# Patient Record
Sex: Female | Born: 2000 | Race: Black or African American | Hispanic: No | Marital: Single | Smoking: Never smoker
Health system: Southern US, Community
[De-identification: ages and names within clinical notes are randomized; demographics above are authoritative.]

## PROBLEM LIST (undated history)

## (undated) ENCOUNTER — Ambulatory Visit: Payer: BC Managed Care – PPO | Source: Home / Self Care

## (undated) DIAGNOSIS — G43909 Migraine, unspecified, not intractable, without status migrainosus: Secondary | ICD-10-CM

---
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
## 2020-01-27 ENCOUNTER — Encounter: Payer: Self-pay | Admitting: Women's Health

## 2020-01-27 ENCOUNTER — Ambulatory Visit (INDEPENDENT_AMBULATORY_CARE_PROVIDER_SITE_OTHER): Payer: Medicaid Other | Admitting: Women's Health

## 2020-01-27 ENCOUNTER — Other Ambulatory Visit: Payer: Self-pay

## 2020-01-27 VITALS — BP 100/58 | HR 79 | Ht 65.0 in | Wt 117.4 lb

## 2020-01-27 DIAGNOSIS — A630 Anogenital (venereal) warts: Secondary | ICD-10-CM

## 2020-01-27 MED ORDER — IMIQUIMOD 5 % EX CREA: TOPICAL_CREAM | CUTANEOUS | 0 refills | Status: DC

## 2020-01-27 NOTE — Patient Instructions (Addendum)
HPV (Human Papillomavirus) Vaccine: What You Need to Know 1. Why get vaccinated? HPV (Human papillomavirus) vaccine can prevent infection with some types of human papillomavirus. HPV infections can cause certain types of cancers including:  cervical, vaginal and vulvar cancers in women,  penile cancer in men, and  anal cancers in both men and women. HPV vaccine prevents infection from the HPV types that cause over 90% of these cancers. HPV is spread through intimate skin-to-skin or sexual contact. HPV infections are so common that nearly all men and women will get at least one type of HPV at some time in their lives. Most HPV infections go away by themselves within 2 years. But sometimes HPV infections will last longer and can cause cancers later in life. 2. HPV vaccine HPV vaccine is routinely recommended for adolescents at 76 or 19 years of age to ensure they are protected before they are exposed to the virus. HPV vaccine may be given beginning at age 31 years, and as late as age 17 years. Most people older than 26 years will not benefit from HPV vaccination. Talk with your health care provider if you want more information. Most children who get the first dose before 46 years of age need 2 doses of HPV vaccine. Anyone who gets the first dose on or after 19 years of age, and younger people with certain immunocompromising conditions, need 3 doses. Your health care provider can give you more information. HPV vaccine may be given at the same time as other vaccines. 3. Talk with your health care provider Tell your vaccine provider if the person getting the vaccine:  Has had an allergic reaction after a previous dose of HPV vaccine, or has any severe, life-threatening allergies.  Is pregnant. In some cases, your health care provider may decide to postpone HPV vaccination to a future visit. People with minor illnesses, such as a cold, may be vaccinated. People who are moderately or severely ill  should usually wait until they recover before getting HPV vaccine. Your health care provider can give you more information. 4. Risks of a vaccine reaction  Soreness, redness, or swelling where the shot is given can happen after HPV vaccine.  Fever or headache can happen after HPV vaccine. People sometimes faint after medical procedures, including vaccination. Tell your provider if you feel dizzy or have vision changes or ringing in the ears. As with any medicine, there is a very remote chance of a vaccine causing a severe allergic reaction, other serious injury, or death. 5. What if there is a serious problem? An allergic reaction could occur after the vaccinated person leaves the clinic. If you see signs of a severe allergic reaction (hives, swelling of the face and throat, difficulty breathing, a fast heartbeat, dizziness, or weakness), call 9-1-1 and get the person to the nearest hospital. For other signs that concern you, call your health care provider. Adverse reactions should be reported to the Vaccine Adverse Event Reporting System (VAERS). Your health care provider will usually file this report, or you can do it yourself. Visit the VAERS website at www.vaers.SamedayNews.es or call 959-256-4543. VAERS is only for reporting reactions, and VAERS staff do not give medical advice. 6. The National Vaccine Injury Compensation Program The [employer] Vaccine Injury Compensation Program (VICP) is a federal program that was created to compensate people who may have been injured by certain vaccines. Visit the VICP website at GoldCloset.com.ee or call 973 792 1440 to learn about the program and about filing a claim. There  is a time limit to file a claim for compensation. 7. How can I learn more?  Ask your health care provider.  Call your local or state health department.  Contact the Centers for Disease Control and Prevention (CDC): ? Call 661-016-4750 (1-800-CDC-INFO) or ? Visit CDC's  website at PicCapture.uy Vaccine Information Statement HPV Vaccine (12/06/2017) This information is not intended to replace advice given to you by your health care provider. Make sure you discuss any questions you have with your health care provider. Document Revised: 05/15/2018 Document Reviewed: 09/05/2017 Elsevier Patient Education  2020 Elsevier Inc.        Genital Warts Genital warts are a common STD (sexually transmitted disease). They may appear as small bumps on the skin of the genital and anal areas. They sometimes become irritated and cause pain. Genital warts are easily passed to other people through sexual contact. Many people do not know that they are infected. They may be infected for years without symptoms. However, even if they do not have symptoms, they can pass the infection to their sexual partners. Getting treatment is important because genital warts can lead to other problems. In females, the virus that causes genital warts may increase the risk for cervical cancer. What are the causes? This condition is caused by a virus that is called human papillomavirus (HPV). HPV is spread by having unprotected sex with an infected person. It can be spread through vaginal, anal, and oral sex. What increases the risk? You are more likely to develop this condition if:  You have unprotected sex.  You have multiple sexual partners.  You are sexually active before age 8.  You are a man who isnot circumcised.  You have a female sexual partner who is not circumcised.  You have a weakened body defense system (immune system) due to disease or medicine.  You smoke. What are the signs or symptoms? Symptoms of this condition include:  Small growths in the genital area or anal area. These warts often grow in clusters.  Itching and irritation in the genital area or anal area.  Bleeding from the warts.  Pain during sex. How is this diagnosed? This condition is diagnosed  based on:  Your symptoms.  A physical exam. You may also have other tests, including:  Biopsy. A tissue sample is removed so it can be checked under a microscope.  Colposcopy. In females, a magnifying tool is used to examine the vagina and cervix. Certain solutions may be used to make the HPV cells change color so they can be seen more easily.  A Pap test in females.  Tests for other STDs. How is this treated? This condition may be treated with:  Medicines, such as: ? Solutions or creams applied to your skin (topical).  Procedures, such as: ? Freezing the warts with liquid nitrogen (cryotherapy). ? Burning the warts with a laser or electric probe (electrocautery). ? Surgery to remove the warts. Follow these instructions at home: Medicines   Apply over-the-counter and prescription medicines only as told by your health care provider.  Do not treat genital warts with medicines that are used for treating hand warts.  Talk with your health care provider about using over-the-counter anti-itch creams. Instructions for women  Get screened regularly for cervical cancer. Women who have genital warts are at an increased risk for this cancer. This type of cancer is slow growing and can be cured if it is found early.  If you become pregnant, tell your health care provider  that you have had an HPV infection. Your health care provider will monitor you closely during pregnancy to be sure that your baby is safe. General instructions  Do not touch or scratch the warts.  Do not have sex until your treatment has been completed.  Tell your current and past sexual partners about your condition because they may also need treatment.  After treatment, use condoms during sex to prevent future infections.  Keep all follow-up visits as told by your health care provider. This is important. How is this prevented? Talk with your health care provider about getting the HPV vaccine. The  vaccine:  Can prevent some HPV infections and cancers.  Is recommended for males and females who are 5-87 years old.  Is not recommended for pregnant women.  Will not work if you already have HPV. Contact a health care provider if you:  Have redness, swelling, or pain in the area of the treated skin.  Have a fever.  Feel generally ill.  Feel lumps in and around your genital or anal area.  Have bleeding in your genital or anal area.  Have pain during sex. Summary  Genital warts are a common STD (sexually transmitted disease). It may appear as small bumps on the genital and anal areas.  This condition is caused by a virus that is called human papillomavirus (HPV). HPV is spread by having unprotected sex with an infected person. It can be spread through vaginal, anal, and oral sex.  Treatment is important because genital warts can lead to other problems. In females, the virus that causes genital warts may increase the risk for cervical cancer.  This condition may be treated with medicine that is applied to the skin, or procedures to remove the warts.  The HPV vaccine can prevent some HPV infections and cancers. It is recommended that the vaccine be given to males and females who are 71-9 years old. This information is not intended to replace advice given to you by your health care provider. Make sure you discuss any questions you have with your health care provider. Document Revised: 02/28/2017 Document Reviewed: 02/28/2017 Elsevier Patient Education  2020 Elsevier Inc.       Imiquimod skin cream What is this medicine? IMIQUIMOD (i mi KWI mod) cream is used to treat external genital or anal warts. It is also used to treat other skin conditions such as actinic keratosis and certain types of skin cancer. This medicine may be used for other purposes; ask your health care provider or pharmacist if you have questions. COMMON BRAND NAME(S): Celesta Aver What should I tell  my health care provider before I take this medicine? They need to know if you have any of these conditions:  decreased immune function  an unusual or allergic reaction to imiquimod, other medicines, foods, dyes, or preservatives  pregnant or trying to get pregnant  breast-feeding How should I use this medicine? This medicine is for external use only. Do not take by mouth. Follow the directions on the prescription label. Apply just before bedtime. Wash your hands before and after use. Apply a thin layer of cream and massage gently into the affected areas until no longer visible. Do not use in the mouth, eyes or the vagina. Use this medicine only on the affected area as directed by your health care provider. Do not use for longer than prescribed. It is important not to use more medicine than prescribed. To do so may increase the chance of side effects. Talk  to your pediatrician regarding the use of this medicine in children. While this drug may be prescribed for children as young as 76 years of age for selected conditions, precautions do apply. Overdosage: If you think you have taken too much of this medicine contact a poison control center or emergency room at once. NOTE: This medicine is only for you. Do not share this medicine with others. What if I miss a dose? If you miss a dose, use it as soon as you can. If it is almost time for your next dose, use only that dose. Do not use double or extra doses. What may interact with this medicine? Interactions are not expected. Do not use any other medicines on the treated area without asking your doctor or health care professional. This list may not describe all possible interactions. Give your health care provider a list of all the medicines, herbs, non-prescription drugs, or dietary supplements you use. Also tell them if you smoke, drink alcohol, or use illegal drugs. Some items may interact with your medicine. What should I watch for while using this  medicine? Visit your health care professional for regular checks on your progress. Do not use this medicine until the skin has healed from any other drug (example: podofilox or podophyllin resin) or surgical skin treatment. Females should receive regular pelvic exams while being treated for genital warts. Most patients see improvement within 4 weeks. It may take up to 16 weeks to see a full clearing of the warts. This medicine is not a cure. New warts may develop during or after treatment. Avoid sexual (genital, anal, oral) contact while the cream is on the skin. If warts are visible in the genital area, sexual contact should be avoided until the warts are treated. The use of latex condoms during sexual contact may reduce, but not entirely prevent, infecting others. This medicine may weaken condoms, diaphragms, cervical caps or other barrier devices and make them less effective as birth control. Do not cover the treated area with an airtight bandage. Cotton gauze dressings can be used. Cotton underwear can be worn after using this medicine on the genital or anal area. Actinic keratoses that were not seen before may appear during treatment and may later go away. The treatment area and surrounding area may lighten or darken after treatment with this medicine. These skin color changes may be permanent in some patients. If you experience a skin reaction at the treatment site that interferes or prevents you from doing any daily activity, contact your health care provider. You may need a rest period from treatment. Treatment may be restarted once the reaction has gotten better as recommended by your doctor or health care professional. This medicine can make you more sensitive to the sun. Keep out of the sun. If you cannot avoid being in the sun, wear protective clothing and use sunscreen. Do not use sun lamps or tanning beds/booths. What side effects may I notice from receiving this medicine? Side effects that you  should report to your doctor or health care professional as soon as possible:  open sores with or without drainage  skin infection  skin rash  unusual or severe skin reaction Side effects that usually do not require medical attention (report to your doctor or health care professional if they continue or are bothersome):  burning or itching  redness of the skin (very common but is usually not painful or harmful)  scabbing, crusting, or peeling skin  skin that becomes hard or  thickened  swelling of the skin This list may not describe all possible side effects. Call your doctor for medical advice about side effects. You may report side effects to FDA at 1-800-FDA-1088. Where should I keep my medicine? Keep out of the reach of children. Store between 4 and 25 degrees C (39 and 77 degrees F). Do not freeze. Throw away any unused medicine after the expiration date. Discard packet after applying to affected area. Partial packets should not be saved or reused. NOTE: This sheet is a summary. It may not cover all possible information. If you have questions about this medicine, talk to your doctor, pharmacist, or health care provider.  2020 Elsevier/Gold Standard (2008-01-08 10:33:25)

## 2020-01-27 NOTE — Progress Notes (Addendum)
History:  [name] is a 19 y.o. No obstetric history on file. who presents to clinic today for genital warts. Patient was told by her pediatrician that she had genital warts. Patient reports she was 4 warts present at this time next to the rectum. Patient reports she has not had the HPV vaccine. Patient reports she is here today because she would like the warts removed. Patient denies bleeding or other concerns regarding the warts. Patient currently has Nexplanon for birth control. Patient reports NKDA, is not currently on any medications, and reports she has no diagnosed medical conditions.  The following portions of the patient's history were reviewed and updated as appropriate: allergies, current medications, family history, past medical history, social history, past surgical history and problem list.  Review of Systems:  Review of Systems  Constitutional: Negative for chills and fever.  Respiratory: Negative for shortness of breath.   Cardiovascular: Negative for chest pain.  Genitourinary: Negative for dysuria, frequency and urgency.  Neurological: Negative for headaches.     Objective:  Physical Exam BP (!) 100/58   Pulse 79   Ht 5\' 5"  (1.651 m)   Wt 117 lb 6.4 oz (53.3 kg)   BMI 19.54 kg/m    Physical Exam Vitals and nursing note reviewed. Exam conducted with a chaperone present.  Constitutional:      General: She is not in acute distress.    Appearance: Normal appearance. She is not ill-appearing, toxic-appearing or diaphoretic.  HENT:     Head: Normocephalic and atraumatic.  Pulmonary:     Effort: Pulmonary effort is normal.  Abdominal:     Palpations: Abdomen is soft.  Genitourinary:    General: Normal vulva.     Labia:        Right: No rash, tenderness or lesion.        Left: No rash, tenderness or lesion.     Skin:    General: Skin is warm and dry.  Neurological:     Mental Status: She is alert and oriented to person, place, and time.  Psychiatric:         Mood and Affect: Mood normal.        Behavior: Behavior normal.        Thought Content: Thought content normal.        Judgment: Judgment normal.    Labs and Imaging No results found for this or any previous visit (from the past 24 hour(s)).  No results found.   Assessment & Plan:   1. Genital warts - imiquimod (ALDARA) 5 % cream; Apply topically 3 (three) times a week.  Dispense: 12 each; Refill: 0 Hand washing before and after cream application is recommended. The patient applies imiquimod cream directly to the clean dry warty tissue at bedtime, rubbing it in until the cream is no longer visible; this area is washed with mild soap and water 6 to 10 hours later. Sexual contact should be avoided while the cream is on the skin. The cream can weaken condoms and diaphragms. Aldara is applied three days per week (Monday-Wednesday-Friday) for up to 16 weeks. A mild, local inflammatory reaction (erythema, induration, ulceration/erosion, itching, burning, vesicles) should occur, which is a sign the drug is working. It is generally not so severe as to preclude further treatment. If severe inflammation occurs, use of the drug should be stopped until the inflammation clears and then it can be restarted at a lower frequency. -pt declines HPV vaccine  Approximately 5 minutes  of total time was spent with this patient on care and education.  Marylen Ponto, NP 01/27/2020 4:17 PM

## 2020-01-27 NOTE — Progress Notes (Signed)
New patient in office Currently has nexplanon placed 02-06-2019 Reports irregular bleeding since getting nexplanon Pt reports that genital warts are not currently painful but cause discomfort during bowel movements.

## 2021-08-11 NOTE — Progress Notes (Deleted)
  Subjective:    ELDONNA NEUENFELDT - 21 y.o. female MRN [id number]  Date of birth: 2000/08/27  HPI  Kymari D Gopal is to establish care.   Current issues and/or concerns:  ROS per HPI     Health Maintenance:  Health Maintenance Due  Topic Date Due   HPV VACCINES (1 - 2-dose series) Never done   CHLAMYDIA SCREENING  Never done   HIV Screening  Never done   Hepatitis C Screening  Never done   TETANUS/TDAP  Never done     Past Medical History: There are no problems to display for this patient.     Social History   reports that she has never smoked. She has never used smokeless tobacco. She reports current alcohol use. She reports current drug use. Drug: Marijuana.   Family History  family history includes Cancer in her maternal grandmother; Diabetes in her maternal grandfather; Hypertension in her father; Stroke in her maternal grandfather.   Medications: reviewed and updated   Objective:   Physical Exam There were no vitals taken for this visit. Physical Exam      Assessment & Plan:         Patient was given clear instructions to go to Emergency Department or return to medical center if symptoms don't improve, worsen, or new problems develop.The patient verbalized understanding.  I discussed the assessment and treatment plan with the patient. The patient was provided an opportunity to ask questions and all were answered. The patient agreed with the plan and demonstrated an understanding of the instructions.   The patient was advised to call back or seek an in-person evaluation if the symptoms worsen or if the condition fails to improve as anticipated.    Ricky Stabs, NP 08/11/2021, 11:58 AM [facility] at [facility]

## 2021-08-20 ENCOUNTER — Ambulatory Visit: Payer: Medicaid Other | Admitting: Family

## 2021-08-20 DIAGNOSIS — Z7689 Persons encountering health services in other specified circumstances: Secondary | ICD-10-CM

## 2022-04-21 IMAGING — CT CT HEAD W/O CM
3 series · 15 of 47 positions shown, 18 images · non-contrast
Comparison: None.

CLINICAL DATA: Neuro deficit.

EXAM:
CT HEAD WITHOUT CONTRAST
TECHNIQUE: Contiguous axial images were obtained from the base of the skull
through the vertex without intravenous contrast.

[Series 2: head wo · axial · 0.45mm/px · z∈[+1175,+1300]mm · 9 of 30 slices shown, 12 images]
[im 3/30  brain]
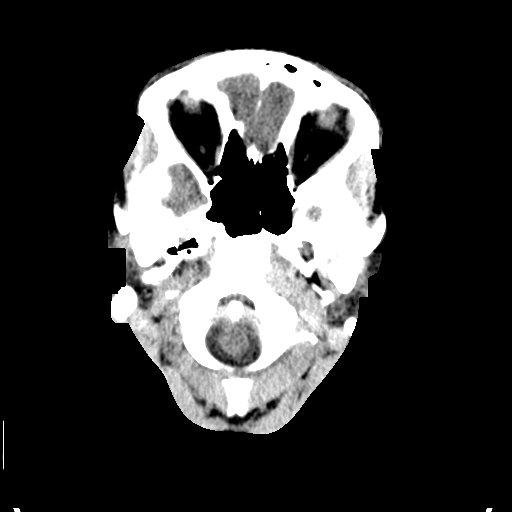
[im 3/30  bone]
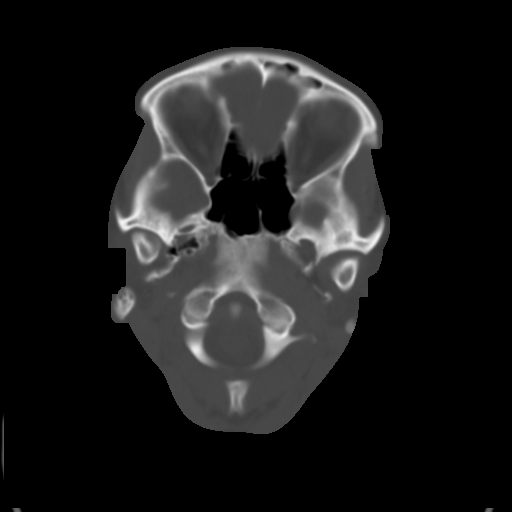
[im 6/30  brain]
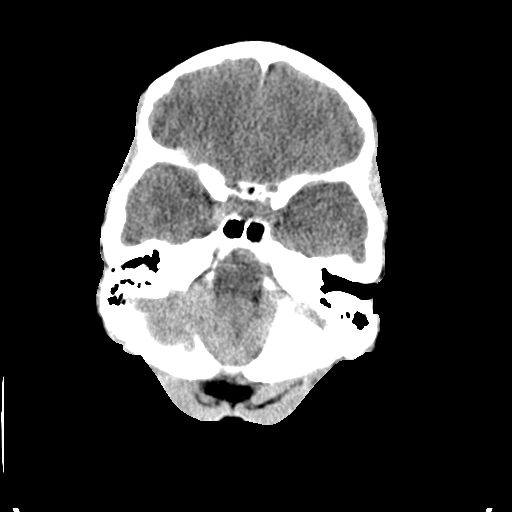
[im 9/30  brain]
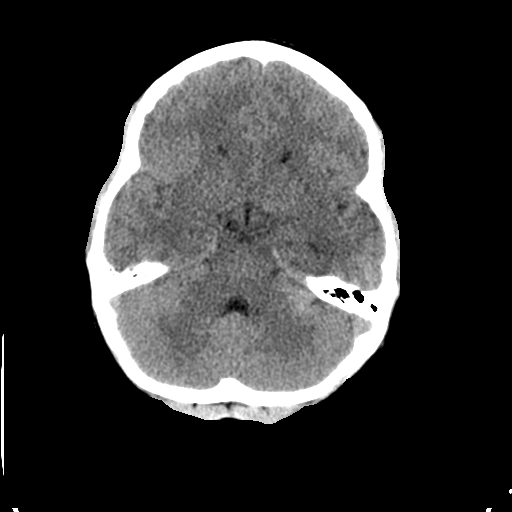
[im 12/30  brain]
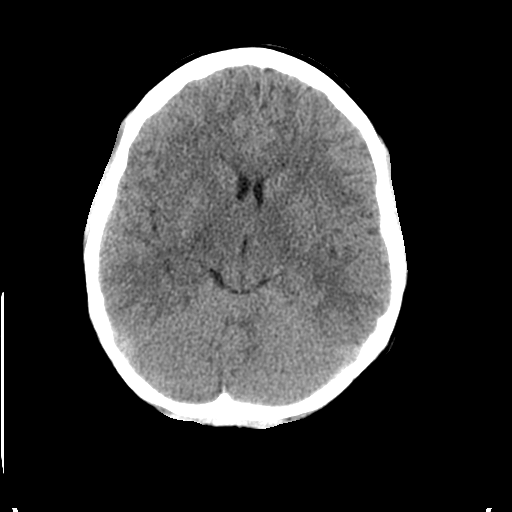
[im 16/30  brain]
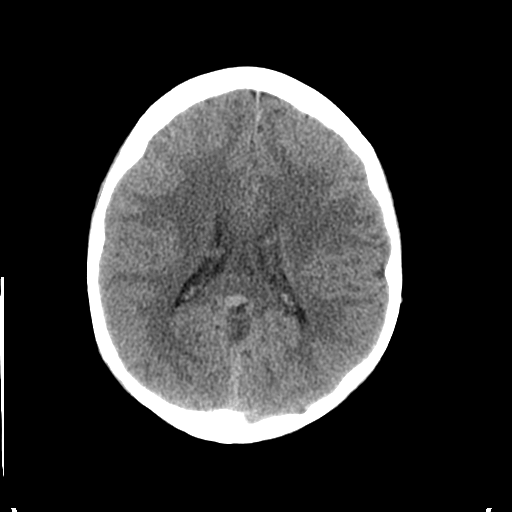
[im 16/30  bone]
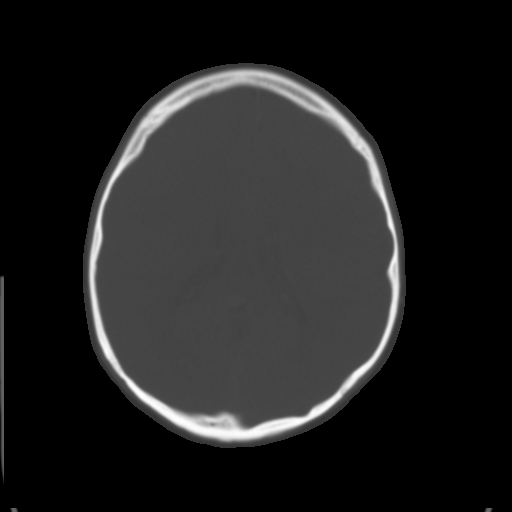
[im 19/30  brain]
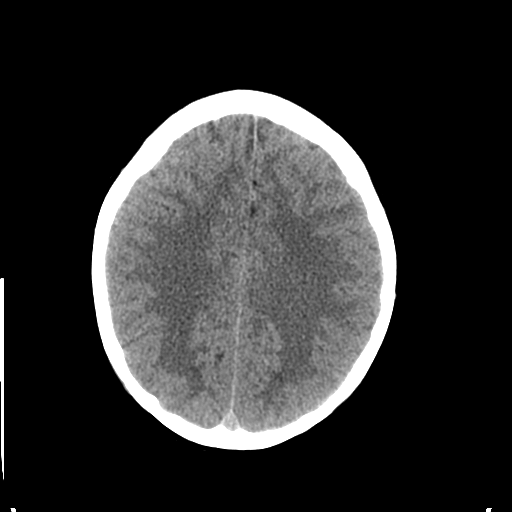
[im 22/30  brain]
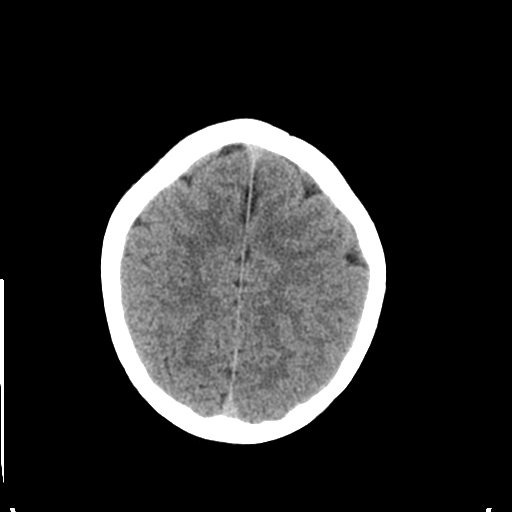
[im 25/30  brain]
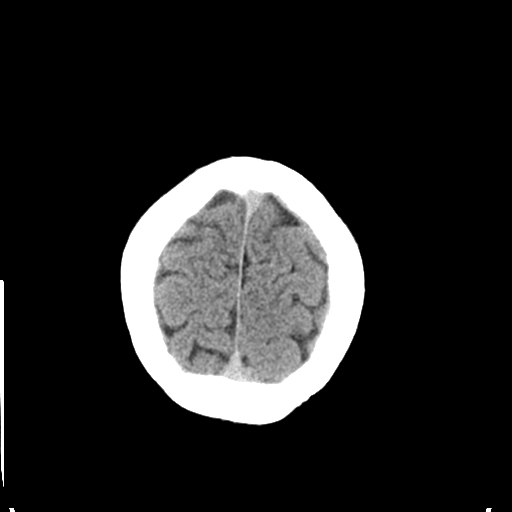
[im 28/30  brain]
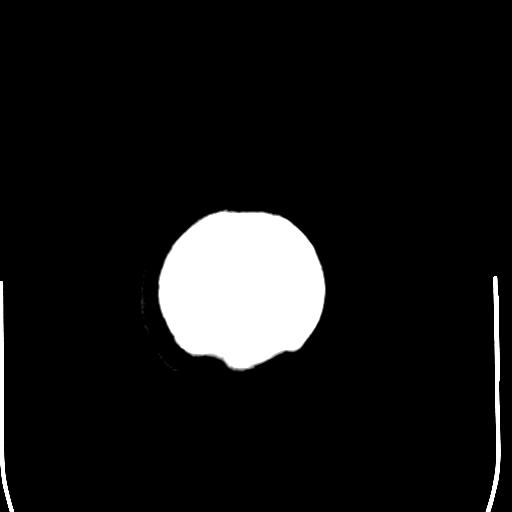
[im 28/30  bone]
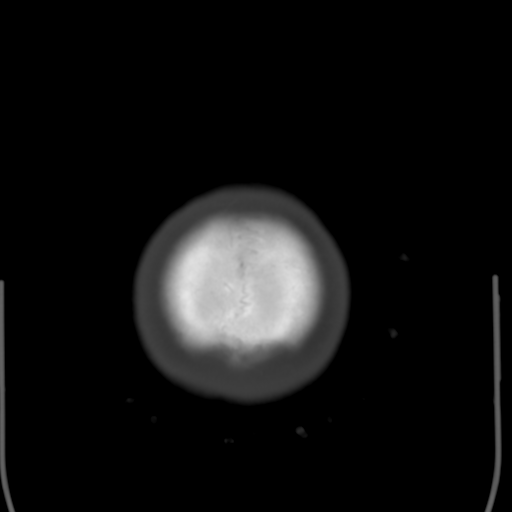

[Series 5: coronal soft tissue · coronal · 0.33mm/px · 3 of 77 slices shown]
[im 28/77  brain]
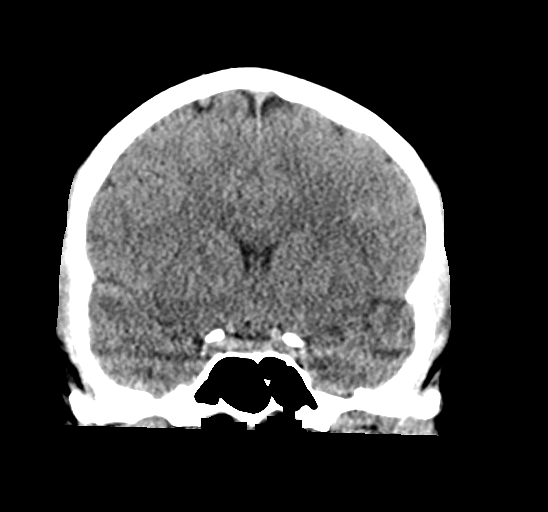
[im 35/77  brain]
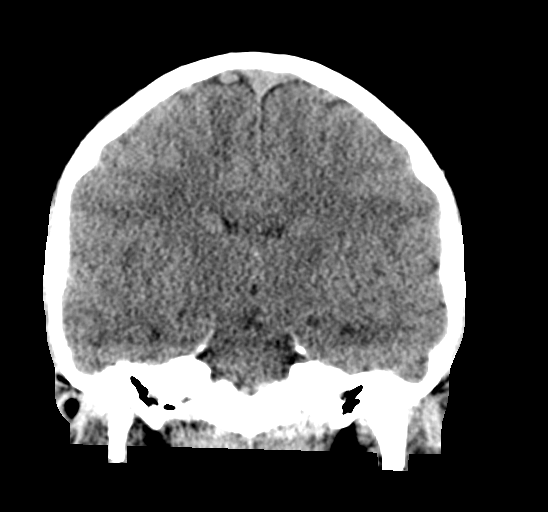
[im 42/77  brain]
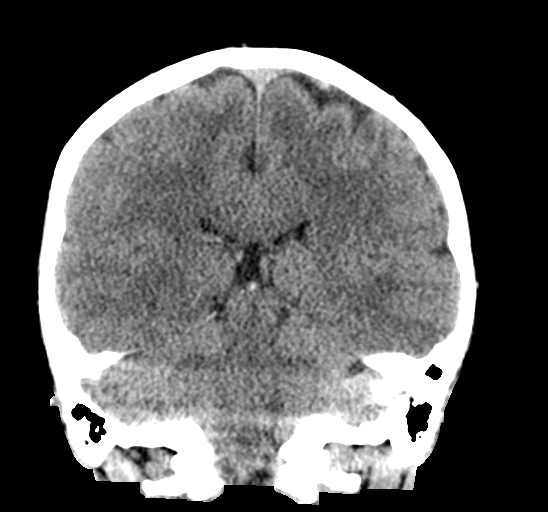

[Series 6: sagittal soft tissue · sagittal · 0.34mm/px · 3 of 58 slices shown]
[im 20/58  brain]
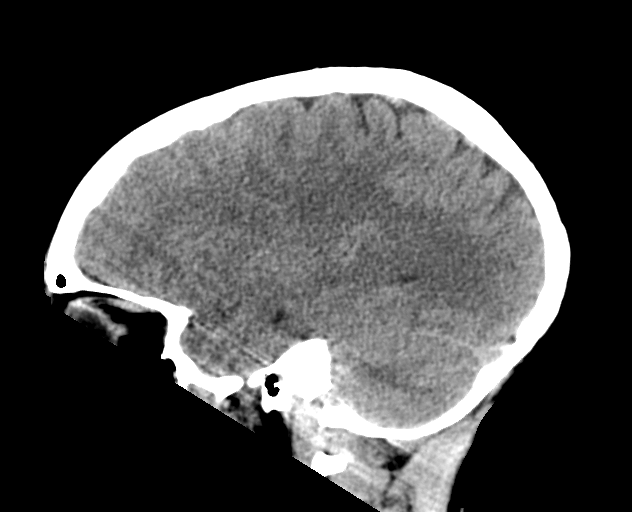
[im 29/58  brain]
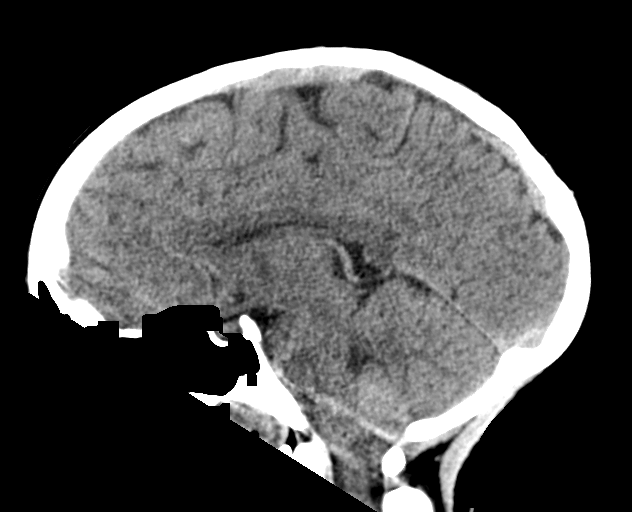
[im 39/58  brain]
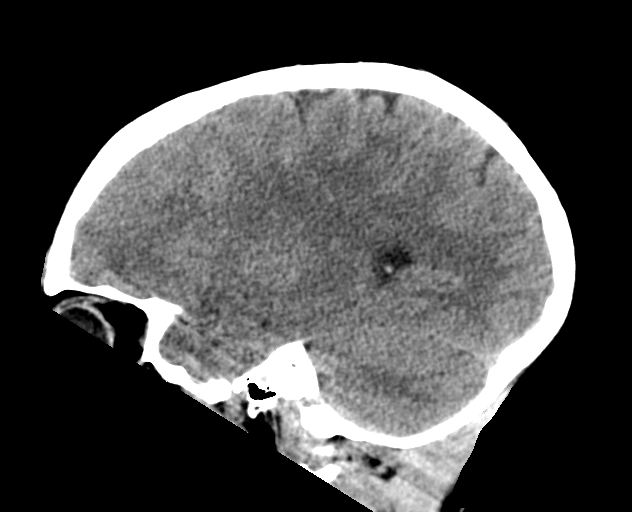

[15 of 47 positions shown; findings below may reference images not displayed]

FINDINGS: Brain: No acute hemorrhage. No hydrocephalus. No mass lesion. There
is mild effacement of the suprasellar cistern with narrowing of the
mamillopontine distance (measuring 3.7 mm). There is mild cerebellar
tonsillar ectopia, measuring approximately 3 mm below the foramina
magnum. Mild effacement of the basal cisterns. No subdural
collections.

Vascular: No hyperdense vessel or unexpected calcification.

Skull: Normal. Negative for fracture or focal lesion.

Sinuses/Orbits: No acute finding.
IMPRESSION: 1. No acute traumatic intracranial abnormality.
2. Mild narrowing of the mamillopontine angle, mild cerebellar
tonsillar ectopia (which does not meet criteria for a Chiari) and
effacement of the suprasellar cistern. Findings are nonspecific, but
can be seen in the setting of intracranial hypotension. Recommend
clinical correlation with history of positional headaches. An
outpatient MRI could further evaluate, if clinically indicated.

## 2022-05-26 ENCOUNTER — Ambulatory Visit
Admission: EM | Admit: 2022-05-26 | Discharge: 2022-05-26 | Disposition: A | Discharge status: Home / Self Care | Payer: BC Managed Care – PPO | Attending: Family Medicine | Admitting: Family Medicine

## 2022-05-26 DIAGNOSIS — N911 Secondary amenorrhea: Secondary | ICD-10-CM | POA: Diagnosis present

## 2022-05-26 DIAGNOSIS — J069 Acute upper respiratory infection, unspecified: Secondary | ICD-10-CM | POA: Diagnosis present

## 2022-05-26 DIAGNOSIS — U071 COVID-19: Secondary | ICD-10-CM | POA: Diagnosis not present

## 2022-05-26 LAB — POCT INFLUENZA A/B
Influenza A, POC: NEGATIVE
Influenza B, POC: NEGATIVE

## 2022-05-26 LAB — POCT URINE PREGNANCY: Preg Test, Ur: NEGATIVE

## 2022-05-26 MED ORDER — BENZONATATE 100 MG PO CAPS: 100.0000 mg | ORAL_CAPSULE | Freq: Three times a day (TID) | ORAL | 0 refills | Status: DC | PRN

## 2022-05-26 MED ORDER — IBUPROFEN 800 MG PO TABS: 800.0000 mg | ORAL_TABLET | Freq: Three times a day (TID) | ORAL | 0 refills | Status: AC | PRN

## 2022-05-26 NOTE — ED Triage Notes (Signed)
Pt presents with non productive cough, sore throat, chills, fatigue, and headache X 2 days.

## 2022-05-26 NOTE — ED Provider Notes (Signed)
[facility]    CSN: [id number] Arrival date & time: 05/26/22  1016      History   Chief Complaint Chief Complaint  Patient presents with   URI   Headache    HPI Victoria Estrada is a 22 y.o. female.    URI Associated symptoms: headaches   Headache Associated symptoms: URI    Here for cough, sore throat when coughs, postnasal drainage, fever and chills. Fever to 101 this AM. Symptoms began 4/15 in the evening.  No n/v/d. She has had some h/a and malaise also.  LMP sometime in December. No longer has "implant" as noted in side bar.  Past Medical History:  Diagnosis Date   Migraine    Migraines     There are no problems to display for this patient.   History reviewed. No pertinent surgical history.  OB History   No obstetric history on file.      Home Medications    Prior to Admission medications   Medication Sig Start Date End Date Taking? Authorizing Provider  benzonatate (TESSALON) 100 MG capsule Take 1 capsule (100 mg total) by mouth 3 (three) times daily as needed for cough. 05/26/22  Yes Zenia Resides, MD  ibuprofen (ADVIL) 800 MG tablet Take 1 tablet (800 mg total) by mouth every 8 (eight) hours as needed (pain). 05/26/22  Yes Zenia Resides, MD  etonogestrel (NEXPLANON) 68 MG IMPL implant 1 each by Subdermal route once.    [provider]  imiquimod (ALDARA) 5 % cream Apply topically 3 (three) times a week. 01/27/20   Nugent, Odie Sera, NP    Family History Family History  Problem Relation Age of Onset   Cancer Maternal Grandmother    Diabetes Maternal Grandfather    Stroke Maternal Grandfather    Hypertension Father     Social History Social History   Tobacco Use   Smoking status: Never   Smokeless tobacco: Never  Vaping Use   Vaping Use: Never used  Substance Use Topics   Alcohol use: Yes    Comment: occasionally   Drug use: Yes    Types: Marijuana     Allergies   Patient has no known  allergies.   Review of Systems Review of Systems  Neurological:  Positive for headaches.     Physical Exam Triage Vital Signs ED Triage Vitals  Enc Vitals Group     BP 05/26/22 1119 118/73     Pulse Rate 05/26/22 1119 (!) 115     Resp 05/26/22 1119 17     Temp 05/26/22 1119 98.7 F (37.1 C)     Temp Source 05/26/22 1119 Oral     SpO2 05/26/22 1119 95 %     Weight --      Height --      Head Circumference --      Peak Flow --      Pain Score 05/26/22 1118 7     Pain Loc --      Pain Edu? --      Excl. in GC? --    No data found.  Updated Vital Signs BP 118/73 (BP Location: Left Arm)   Pulse (!) 115   Temp 98.7 F (37.1 C) (Oral)   Resp 17   LMP 02/06/2022   SpO2 95%   Visual Acuity Right Eye Distance:   Left Eye Distance:   Bilateral Distance:    Right Eye Near:   Left Eye Near:  Bilateral Near:     Physical Exam Vitals reviewed.  Constitutional:      General: She is not in acute distress.    Appearance: She is not toxic-appearing.  HENT:     Right Ear: Tympanic membrane and ear canal normal.     Left Ear: Tympanic membrane and ear canal normal.     Nose: Congestion present.     Mouth/Throat:     Mouth: Mucous membranes are moist.     Comments: There is white and clear mucus draining in the oropharynx Eyes:     Extraocular Movements: Extraocular movements intact.     Conjunctiva/sclera: Conjunctivae normal.     Pupils: Pupils are equal, round, and reactive to light.  Cardiovascular:     Rate and Rhythm: Normal rate and regular rhythm.     Heart sounds: No murmur heard. Pulmonary:     Effort: Pulmonary effort is normal. No respiratory distress.     Breath sounds: No stridor. No wheezing, rhonchi or rales.  Musculoskeletal:     Cervical back: Neck supple.  Lymphadenopathy:     Cervical: No cervical adenopathy.  Skin:    Capillary Refill: Capillary refill takes less than 2 seconds.     Coloration: Skin is not jaundiced or pale.   Neurological:     General: No focal deficit present.     Mental Status: She is alert and oriented to person, place, and time.  Psychiatric:        Behavior: Behavior normal.      UC Treatments / Results  Labs (all labs ordered are listed, but only abnormal results are displayed) Labs Reviewed  SARS CORONAVIRUS 2 (TAT 6-24 HRS)  POCT INFLUENZA A/B  POCT URINE PREGNANCY    EKG   Radiology No results found.  Procedures Procedures (including critical care time)  Medications Ordered in UC Medications - No data to display  Initial Impression / Assessment and Plan / UC Course  I have reviewed the triage vital signs and the nursing notes.  Pertinent labs & imaging results that were available during my care of the patient were reviewed by me and considered in my medical decision making (see chart for details).        Flu test is negative. UPT is negative. COVID testing is done today, and if pos, she will know if she needs to quarantine.   Tessalon Perles are sent in for the cough and ibuprofen is sent in for the pain.  Final Clinical Impressions(s) / UC Diagnoses   Final diagnoses:  Viral URI  Secondary amenorrhea     Discharge Instructions      The flu test was negative.  The pregnancy test was negative.   You have been swabbed for COVID, and the test will result in the next 24 hours. Our staff will call you if positive. If the COVID test is positive, you should quarantine until you are fever free for 24 hours and you are starting to feel better, and then take added precautions for the next 5 days, such as physical distancing/wearing a mask and good hand hygiene/washing.  Take benzonatate 100 mg, 1 tab every 8 hours as needed for cough.  Take ibuprofen 800 mg--1 tab every 8 hours as needed for pain.  Please follow-up with your primary care about your irregular periods     ED Prescriptions     Medication Sig Dispense Auth. Provider   benzonatate  (TESSALON) 100 MG capsule Take 1 capsule (100 mg total)  by mouth 3 (three) times daily as needed for cough. 21 capsule Zenia Resides, MD   ibuprofen (ADVIL) 800 MG tablet Take 1 tablet (800 mg total) by mouth every 8 (eight) hours as needed (pain). 21 tablet Gus Littler, Janace Aris, MD      PDMP not reviewed this encounter.   Zenia Resides, MD 05/26/22 [phone]

## 2022-05-26 NOTE — Discharge Instructions (Signed)
The flu test was negative.  The pregnancy test was negative.   You have been swabbed for COVID, and the test will result in the next 24 hours. Our staff will call you if positive. If the COVID test is positive, you should quarantine until you are fever free for 24 hours and you are starting to feel better, and then take added precautions for the next 5 days, such as physical distancing/wearing a mask and good hand hygiene/washing.  Take benzonatate 100 mg, 1 tab every 8 hours as needed for cough.  Take ibuprofen 800 mg--1 tab every 8 hours as needed for pain.  Please follow-up with your primary care about your irregular periods

## 2022-05-27 LAB — SARS CORONAVIRUS 2 (TAT 6-24 HRS): SARS Coronavirus 2: POSITIVE — AB

## 2022-06-21 ENCOUNTER — Other Ambulatory Visit: Payer: Self-pay | Admitting: Family Medicine

## 2022-06-21 DIAGNOSIS — N912 Amenorrhea, unspecified: Secondary | ICD-10-CM

## 2022-06-22 ENCOUNTER — Ambulatory Visit
Admission: RE | Admit: 2022-06-22 | Discharge: 2022-06-22 | Disposition: A | Discharge status: Home / Self Care | Payer: BC Managed Care – PPO | Source: Ambulatory Visit | Attending: Family Medicine | Admitting: Family Medicine

## 2022-06-22 DIAGNOSIS — N912 Amenorrhea, unspecified: Secondary | ICD-10-CM

## 2022-12-14 ENCOUNTER — Encounter: Payer: Self-pay | Admitting: *Deleted

## 2022-12-14 ENCOUNTER — Other Ambulatory Visit: Payer: Self-pay

## 2022-12-14 ENCOUNTER — Ambulatory Visit
Admission: EM | Admit: 2022-12-14 | Discharge: 2022-12-14 | Disposition: A | Discharge status: Home / Self Care | Payer: BC Managed Care – PPO | Attending: Physician Assistant | Admitting: Physician Assistant

## 2022-12-14 DIAGNOSIS — N3000 Acute cystitis without hematuria: Secondary | ICD-10-CM | POA: Diagnosis present

## 2022-12-14 LAB — POCT URINALYSIS DIP (MANUAL ENTRY)
Bilirubin, UA: NEGATIVE
Glucose, UA: NEGATIVE mg/dL
Ketones, POC UA: NEGATIVE mg/dL
Nitrite, UA: NEGATIVE
Protein Ur, POC: NEGATIVE mg/dL
Spec Grav, UA: 1.015 (ref 1.010–1.025)
Urobilinogen, UA: 1 U/dL
pH, UA: 7 (ref 5.0–8.0)

## 2022-12-14 LAB — POCT URINE PREGNANCY: Preg Test, Ur: NEGATIVE

## 2022-12-14 MED ORDER — NITROFURANTOIN MONOHYD MACRO 100 MG PO CAPS: 100.0000 mg | ORAL_CAPSULE | Freq: Two times a day (BID) | ORAL | 0 refills | Status: DC

## 2022-12-14 NOTE — ED Provider Notes (Signed)
[facility]    CSN: [id number] Arrival date & time: 12/14/22  0805      History   Chief Complaint Chief Complaint  Patient presents with   Dysuria    HPI Victoria Estrada is a 22 y.o. female.   Patient here today for evaluation of dysuria and urinary frequency she has had the last 3 days.  She denies any back pain, abdominal pain, nausea or vomiting.  She has not had fever.  The history is provided by the patient.  Dysuria Associated symptoms: no abdominal pain, no fever, no nausea, no vaginal discharge and no vomiting     Past Medical History:  Diagnosis Date   Migraine    Migraines     There are no problems to display for this patient.   No past surgical history on file.  OB History   No obstetric history on file.      Home Medications    Prior to Admission medications   Medication Sig Start Date End Date Taking? Authorizing Provider  nitrofurantoin, macrocrystal-monohydrate, (MACROBID) 100 MG capsule Take 1 capsule (100 mg total) by mouth 2 (two) times daily. 12/14/22  Yes Tomi Bamberger, PA-C  benzonatate (TESSALON) 100 MG capsule Take 1 capsule (100 mg total) by mouth 3 (three) times daily as needed for cough. 05/26/22   Zenia Resides, MD  etonogestrel (NEXPLANON) 68 MG IMPL implant 1 each by Subdermal route once.    [provider]  ibuprofen (ADVIL) 800 MG tablet Take 1 tablet (800 mg total) by mouth every 8 (eight) hours as needed (pain). 05/26/22   Zenia Resides, MD  imiquimod Mathis Dad) 5 % cream Apply topically 3 (three) times a week. 01/27/20   Nugent, Odie Sera, NP    Family History Family History  Problem Relation Age of Onset   Cancer Maternal Grandmother    Diabetes Maternal Grandfather    Stroke Maternal Grandfather    Hypertension Father     Social History Social History   Tobacco Use   Smoking status: Never   Smokeless tobacco: Never  Vaping Use   Vaping status: Never Used  Substance Use Topics   Alcohol  use: Yes    Comment: occasionally   Drug use: Not Currently    Types: Marijuana     Allergies   Patient has no known allergies.   Review of Systems Review of Systems  Constitutional:  Negative for chills and fever.  Eyes:  Negative for discharge and redness.  Gastrointestinal:  Negative for abdominal pain, nausea and vomiting.  Genitourinary:  Positive for dysuria. Negative for vaginal discharge.     Physical Exam Triage Vital Signs ED Triage Vitals  Encounter Vitals Group     BP 12/14/22 0839 (!) 123/90     Systolic BP Percentile --      Diastolic BP Percentile --      Pulse Rate 12/14/22 0839 99     Resp 12/14/22 0839 16     Temp 12/14/22 0839 98.4 F (36.9 C)     Temp src --      SpO2 --      Weight --      Height --      Head Circumference --      Peak Flow --      Pain Score 12/14/22 0837 2     Pain Loc --      Pain Education --      Exclude from Growth Chart --  No data found.  Updated Vital Signs BP (!) 123/90 (BP Location: Left Arm)   Pulse 99   Temp 98.4 F (36.9 C)   Resp 16   LMP 08/08/2022 (Approximate)      Physical Exam Vitals and nursing note reviewed.  Constitutional:      General: She is not in acute distress.    Appearance: Normal appearance. She is not ill-appearing.  HENT:     Head: Normocephalic and atraumatic.  Eyes:     Conjunctiva/sclera: Conjunctivae normal.  Cardiovascular:     Rate and Rhythm: Normal rate.  Pulmonary:     Effort: Pulmonary effort is normal. No respiratory distress.  Neurological:     Mental Status: She is alert.  Psychiatric:        Mood and Affect: Mood normal.        Behavior: Behavior normal.        Thought Content: Thought content normal.      UC Treatments / Results  Labs (all labs ordered are listed, but only abnormal results are displayed) Labs Reviewed  URINE CULTURE - Abnormal; Notable for the following components:      Result Value   Culture MULTIPLE SPECIES PRESENT, SUGGEST  RECOLLECTION (*)    All other components within normal limits  POCT URINALYSIS DIP (MANUAL ENTRY) - Abnormal; Notable for the following components:   Clarity, UA hazy (*)    Blood, UA small (*)    Leukocytes, UA Moderate (2+) (*)    All other components within normal limits  POCT URINE PREGNANCY    EKG   Radiology No results found.  Procedures Procedures (including critical care time)  Medications Ordered in UC Medications - No data to display  Initial Impression / Assessment and Plan / UC Course  I have reviewed the triage vital signs and the nursing notes.  Pertinent labs & imaging results that were available during my care of the patient were reviewed by me and considered in my medical decision making (see chart for details).    Will treat to cover UTI with Macrobid.  Urine culture ordered.  Recommended follow-up if no gradual improvement with treatment or with any further concerns.  Final diagnoses:  Acute cystitis without hematuria   Discharge Instructions   None    ED Prescriptions     Medication Sig Dispense Auth. Provider   nitrofurantoin, macrocrystal-monohydrate, (MACROBID) 100 MG capsule Take 1 capsule (100 mg total) by mouth 2 (two) times daily. 10 capsule Tomi Bamberger, PA-C      PDMP not reviewed this encounter.   Tomi Bamberger, PA-C 01/04/23 [phone]

## 2022-12-14 NOTE — ED Triage Notes (Signed)
 Dysuria and frequency x 3 days.

## 2022-12-15 LAB — URINE CULTURE

## 2023-01-28 ENCOUNTER — Encounter (HOSPITAL_BASED_OUTPATIENT_CLINIC_OR_DEPARTMENT_OTHER): Payer: Self-pay

## 2023-01-28 ENCOUNTER — Encounter: Payer: Self-pay | Admitting: Emergency Medicine

## 2023-01-28 ENCOUNTER — Ambulatory Visit
Admission: EM | Admit: 2023-01-28 | Discharge: 2023-01-28 | Disposition: A | Discharge status: Other Acute Inpatient Hospital | Payer: BC Managed Care – PPO

## 2023-01-28 ENCOUNTER — Emergency Department (HOSPITAL_BASED_OUTPATIENT_CLINIC_OR_DEPARTMENT_OTHER)
Admission: EM | Admit: 2023-01-28 | Discharge: 2023-01-28 | Disposition: A | Discharge status: Home / Self Care | Payer: BC Managed Care – PPO | Attending: Emergency Medicine | Admitting: Emergency Medicine

## 2023-01-28 ENCOUNTER — Other Ambulatory Visit: Payer: Self-pay

## 2023-01-28 DIAGNOSIS — M795 Residual foreign body in soft tissue: Secondary | ICD-10-CM

## 2023-01-28 DIAGNOSIS — S30850A Superficial foreign body of lower back and pelvis, initial encounter: Secondary | ICD-10-CM | POA: Diagnosis present

## 2023-01-28 DIAGNOSIS — W4904XA Ring or other jewelry causing external constriction, initial encounter: Secondary | ICD-10-CM | POA: Diagnosis not present

## 2023-01-28 DIAGNOSIS — L089 Local infection of the skin and subcutaneous tissue, unspecified: Secondary | ICD-10-CM

## 2023-01-28 LAB — CBC WITH DIFFERENTIAL/PLATELET
Abs Immature Granulocytes: 0.02 10*3/uL (ref 0.00–0.07)
Basophils Absolute: 0 10*3/uL (ref 0.0–0.1)
Basophils Relative: 0 %
Eosinophils Absolute: 0.1 10*3/uL (ref 0.0–0.5)
Eosinophils Relative: 1 %
HCT: 40 % (ref 36.0–46.0)
Hemoglobin: 13.8 g/dL (ref 12.0–15.0)
Immature Granulocytes: 0 %
Lymphocytes Relative: 42 %
Lymphs Abs: 2.5 10*3/uL (ref 0.7–4.0)
MCH: 29.7 pg (ref 26.0–34.0)
MCHC: 34.5 g/dL (ref 30.0–36.0)
MCV: 86.2 fL (ref 80.0–100.0)
Monocytes Absolute: 0.6 10*3/uL (ref 0.1–1.0)
Monocytes Relative: 10 %
Neutro Abs: 2.7 10*3/uL (ref 1.7–7.7)
Neutrophils Relative %: 47 %
Platelets: 296 10*3/uL (ref 150–400)
RBC: 4.64 MIL/uL (ref 3.87–5.11)
RDW: 13.1 % (ref 11.5–15.5)
WBC: 5.9 10*3/uL (ref 4.0–10.5)
nRBC: 0 % (ref 0.0–0.2)

## 2023-01-28 LAB — COMPREHENSIVE METABOLIC PANEL
ALT: 28 U/L (ref 0–44)
AST: 23 U/L (ref 15–41)
Albumin: 4.3 g/dL (ref 3.5–5.0)
Alkaline Phosphatase: 79 U/L (ref 38–126)
Anion gap: 9 (ref 5–15)
BUN: 10 mg/dL (ref 6–20)
CO2: 24 mmol/L (ref 22–32)
Calcium: 9.4 mg/dL (ref 8.9–10.3)
Chloride: 106 mmol/L (ref 98–111)
Creatinine, Ser: 0.72 mg/dL (ref 0.44–1.00)
GFR, Estimated: >60 mL/min (ref >60)
Glucose, Bld: 121 mg/dL — ABNORMAL HIGH (ref 70–99)
Potassium: 3.9 mmol/L (ref 3.5–5.1)
Sodium: 139 mmol/L (ref 135–145)
Total Bilirubin: 0.5 mg/dL (ref <1.2)
Total Protein: 7.6 g/dL (ref 6.5–8.1)

## 2023-01-28 MED ORDER — CEPHALEXIN 500 MG PO CAPS: 500.0000 mg | ORAL_CAPSULE | Freq: Three times a day (TID) | ORAL | 0 refills | Status: DC

## 2023-01-28 MED ORDER — LIDOCAINE-EPINEPHRINE (PF) 2 %-1:200000 IJ SOLN
10.0000 mL | Freq: Once | INTRAMUSCULAR | Status: AC
  Administered 2023-01-28: 10 mL via INTRADERMAL
  Filled 2023-01-28: qty 20

## 2023-01-28 MED ORDER — CEPHALEXIN 250 MG PO CAPS
500.0000 mg | ORAL_CAPSULE | Freq: Once | ORAL | Status: AC
  Administered 2023-01-28: 500 mg via ORAL
  Filled 2023-01-28: qty 2

## 2023-01-28 NOTE — ED Provider Notes (Signed)
Patient presents for dermal piercing that have become imbedded and possibly infected in her back. Recommended removal in ED given unknown depth and involvement of piercing. Patient is agreeable and will transport to ED via POV.    Tomi Bamberger, PA-C 01/28/23 [phone]

## 2023-01-28 NOTE — ED Provider Notes (Signed)
[place] [facility] AT [facility] Provider Note   CSN: [id number] Arrival date & time: 01/28/23  1710     History  Chief Complaint  Patient presents with   Wound Infection    piercing    Victoria Estrada is a 22 y.o. female.  Patient sent from Urgent Care for removal of embedded piercings to lower back that have grown over and are now painful. No fever. Patient reports the right piercing does drain.   The history is provided by the patient. No language interpreter was used.       Home Medications Prior to Admission medications   Medication Sig Start Date End Date Taking? Authorizing Provider  cephALEXin (KEFLEX) 500 MG capsule Take 1 capsule (500 mg total) by mouth 3 (three) times daily. 01/28/23  Yes Elishia Kaczorowski, Melvenia Beam, PA-C  benzonatate (TESSALON) 100 MG capsule Take 1 capsule (100 mg total) by mouth 3 (three) times daily as needed for cough. 05/26/22   Zenia Resides, MD  etonogestrel (NEXPLANON) 68 MG IMPL implant 1 each by Subdermal route once.    [provider]  ibuprofen (ADVIL) 800 MG tablet Take 1 tablet (800 mg total) by mouth every 8 (eight) hours as needed (pain). 05/26/22   Zenia Resides, MD  imiquimod Mathis Dad) 5 % cream Apply topically 3 (three) times a week. 01/27/20   Nugent, Odie Sera, NP  nitrofurantoin, macrocrystal-monohydrate, (MACROBID) 100 MG capsule Take 1 capsule (100 mg total) by mouth 2 (two) times daily. Patient not taking: Reported on 01/28/2023 12/14/22   Tomi Bamberger, PA-C      Allergies    Patient has no known allergies.    Review of Systems   Review of Systems  Physical Exam Updated Vital Signs BP (!) 136/98   Pulse 76   Temp 98.1 F (36.7 C)   Resp 17   SpO2 98%  Physical Exam Constitutional:      General: She is not in acute distress.    Appearance: She is well-developed. She is not ill-appearing.  Pulmonary:     Effort: Pulmonary effort is normal.  Musculoskeletal:        General: Normal  range of motion.     Cervical back: Normal range of motion.  Skin:    General: Skin is warm and dry.     Comments: 2 single embedded posts in lower back. The left piercing cap is visible. No surrounding redness or induration. Nontender. The right piercing cap is not visible but is palpable. No redness or induration. No active drainage. Minimally tender.   Neurological:     Mental Status: She is alert and oriented to person, place, and time.     ED Results / Procedures / Treatments   Labs (all labs ordered are listed, but only abnormal results are displayed) Labs Reviewed  COMPREHENSIVE METABOLIC PANEL - Abnormal; Notable for the following components:      Result Value   Glucose, Bld 121 (*)    All other components within normal limits  CBC WITH DIFFERENTIAL/PLATELET    EKG None  Radiology No results found.  Procedures .Foreign Body Removal  Date/Time: 01/28/2023 8:34 PM  Performed by: Elpidio Anis, PA-C Authorized by: Elpidio Anis, PA-C  Consent: Verbal consent obtained. Consent given by: patient Patient understanding: patient states understanding of the procedure being performed Patient identity confirmed: verbally with patient Body area: skin General location: trunk Location details: back Anesthesia: local infiltration  Anesthesia: Local Anesthetic: lidocaine 2% with epinephrine Anesthetic  total: 2 mL  Sedation: Patient sedated: no  Patient restrained: no Patient cooperative: yes Complexity: simple 2 objects recovered. Objects recovered: piercing rhinestone caps and posts Post-procedure assessment: foreign body removed Patient tolerance: patient tolerated the procedure well with no immediate complications      Medications Ordered in ED Medications  cephALEXin (KEFLEX) capsule 500 mg (has no administration in time range)  lidocaine-EPINEPHrine (XYLOCAINE W/EPI) 2 %-1:200000 (PF) injection 10 mL (10 mLs Intradermal Given 01/28/23 2026)    ED  Course/ Medical Decision Making/ A&P Clinical Course as of 01/28/23 2038  Sat Jan 28, 2023  2035 Patient to eD with embedded piercing to lower back. She has 2 piercings and would like both removed. These were easily removed per procedure note. No evidence abscess or significant cutaneous infection. Will cover for 3 days with keflex base don patient's history of drainage.  [SU]    Clinical Course User Index [SU] Elpidio Anis, PA-C                                 Medical Decision Making Amount and/or Complexity of Data Reviewed Labs: ordered.  Risk Prescription drug management.           Final Clinical Impression(s) / ED Diagnoses Final diagnoses:  Foreign body (FB) in soft tissue    Rx / DC Orders ED Discharge Orders          Ordered    cephALEXin (KEFLEX) 500 MG capsule  3 times daily        01/28/23 2037              Elpidio Anis, PA-C 01/28/23 2038    Royanne Foots, DO 01/30/23 0025

## 2023-01-28 NOTE — ED Triage Notes (Signed)
Pt here for dermal piercing to her back that she feels are infected; pt sts obtained 1 year ago

## 2023-01-28 NOTE — Discharge Instructions (Signed)
Keep the areas to low back clean with soap and water 1-2 times daily. Take Keflex 3 times daily for 3 days.

## 2023-01-28 NOTE — ED Notes (Signed)
Patient is being discharged from the Urgent Care and sent to the Emergency Department via POV . Per RM, patient is in need of higher level of care due to infected piercing. Patient is aware and verbalizes understanding of plan of care.  Vitals:   01/28/23 1533  BP: 133/84  Pulse: 90  Resp: 18  Temp: 98 F (36.7 C)  SpO2: 96%

## 2023-01-28 NOTE — ED Triage Notes (Signed)
Pt c/o swelling to dermal piercing in back, pierced approx 8yr ago. States she wants them removed r/t swelling, drainage. Concern for infection

## 2023-02-25 ENCOUNTER — Ambulatory Visit: Payer: BC Managed Care – PPO

## 2023-04-11 DIAGNOSIS — F4310 Post-traumatic stress disorder, unspecified: Secondary | ICD-10-CM | POA: Insufficient documentation

## 2023-06-23 ENCOUNTER — Encounter: Payer: Self-pay | Admitting: Emergency Medicine

## 2023-06-23 ENCOUNTER — Ambulatory Visit
Admission: EM | Admit: 2023-06-23 | Discharge: 2023-06-23 | Disposition: A | Discharge status: Home / Self Care | Attending: Family Medicine | Admitting: Family Medicine

## 2023-06-23 DIAGNOSIS — N898 Other specified noninflammatory disorders of vagina: Secondary | ICD-10-CM | POA: Diagnosis not present

## 2023-06-23 DIAGNOSIS — Z202 Contact with and (suspected) exposure to infections with a predominantly sexual mode of transmission: Secondary | ICD-10-CM | POA: Diagnosis present

## 2023-06-23 LAB — POCT URINALYSIS DIP (MANUAL ENTRY)
Bilirubin, UA: NEGATIVE
Glucose, UA: NEGATIVE mg/dL
Ketones, POC UA: NEGATIVE mg/dL
Leukocytes, UA: NEGATIVE
Nitrite, UA: NEGATIVE
Protein Ur, POC: NEGATIVE mg/dL
Spec Grav, UA: >=1.03 — AB (ref 1.010–1.025)
Urobilinogen, UA: 0.2 U/dL
pH, UA: 6 (ref 5.0–8.0)

## 2023-06-23 LAB — POCT URINE PREGNANCY: Preg Test, Ur: NEGATIVE

## 2023-06-23 MED ORDER — DOXYCYCLINE HYCLATE 100 MG PO CAPS: 100.0000 mg | ORAL_CAPSULE | Freq: Two times a day (BID) | ORAL | 0 refills | Status: AC

## 2023-06-23 NOTE — ED Provider Notes (Signed)
 [facility]    CSN: [id number] Arrival date & time: 06/23/23  5638      History   Chief Complaint Chief Complaint  Patient presents with   Vaginal Discharge    HPI Victoria Estrada is a 23 y.o. female.  Patient here today for treatment after being made aware that of recent sexual partner tested positive for chlamydia.  Also reports an abnormal vaginal discharge with an abnormal odor x 1 week.  Patient is requesting treatment.  She is unaware if her partner has been treated.  Last menstrual period date.  Of note patient tested positive for chlamydia January 2025 chart review at a different facility.   Past Medical History:  Diagnosis Date   Migraine    Migraines     Patient Active Problem List   Diagnosis Date Noted   Post traumatic stress disorder 04/11/2023    History reviewed. No pertinent surgical history.  OB History   No obstetric history on file.      Home Medications    Prior to Admission medications   Medication Sig Start Date End Date Taking? Authorizing Provider  doxycycline  (VIBRAMYCIN ) 100 MG capsule Take 1 capsule (100 mg total) by mouth 2 (two) times daily for 7 days. 06/23/23 06/30/23 Yes Buena Carmine, NP  metFORMIN (GLUCOPHAGE) 500 MG tablet Take 500 mg by mouth. 05/16/23 11/12/23 Yes [provider]  metroNIDAZOLE (FLAGYL) 500 MG tablet Take 500 mg by mouth 2 (two) times daily. 03/02/23  Yes [provider]  Vitamin D, Ergocalciferol, (DRISDOL) 1.25 MG (50000 UNIT) CAPS capsule Take 50,000 Units by mouth once a week. 05/17/23  Yes [provider]  benzonatate  (TESSALON ) 100 MG capsule Take 1 capsule (100 mg total) by mouth 3 (three) times daily as needed for cough. 05/26/22   Ann Keto, MD  cephALEXin  (KEFLEX ) 500 MG capsule Take 1 capsule (500 mg total) by mouth 3 (three) times daily. 01/28/23   Mandy Second, PA-C  etonogestrel (NEXPLANON) 68 MG IMPL implant 1 each by Subdermal route once.    [provider]  hydrOXYzine (ATARAX) 25 MG tablet Take 25 mg by mouth 3 (three) times daily as needed.    [provider]  ibuprofen  (ADVIL ) 800 MG tablet Take 1 tablet (800 mg total) by mouth every 8 (eight) hours as needed (pain). 05/26/22   Ann Keto, MD  imiquimod  (ALDARA ) 5 % cream Apply topically 3 (three) times a week. 01/27/20   Nugent, Merilee Stanley, NP  nitrofurantoin , macrocrystal-monohydrate, (MACROBID ) 100 MG capsule Take 1 capsule (100 mg total) by mouth 2 (two) times daily. Patient not taking: Reported on 01/28/2023 12/14/22   Vernestine Gondola, PA-C  sertraline (ZOLOFT) 25 MG tablet Take 1 tablet by mouth daily. 08/17/23 08/16/24  [provider]    Family History Family History  Problem Relation Age of Onset   Cancer Maternal Grandmother    Diabetes Maternal Grandfather    Stroke Maternal Grandfather    Hypertension Father     Social History Social History   Tobacco Use   Smoking status: Never    Passive exposure: Never   Smokeless tobacco: Never  Vaping Use   Vaping status: Never Used  Substance Use Topics   Alcohol use: Yes    Comment: occasionally   Drug use: Not Currently    Types: Marijuana     Allergies   Patient has no known allergies.   Review of Systems Review of Systems  Genitourinary:  Positive for vaginal discharge.     Physical Exam Triage Vital Signs ED Triage Vitals  Encounter Vitals Group     BP 06/23/23 1025 116/75     Systolic BP Percentile --      Diastolic BP Percentile --      Pulse Rate 06/23/23 1025 90     Resp 06/23/23 1025 16     Temp 06/23/23 1025 98.2 F (36.8 C)     Temp Source 06/23/23 1025 Oral     SpO2 06/23/23 1025 97 %     Weight 06/23/23 1024 117 lb 8.1 oz (53.3 kg)     Height --      Head Circumference --      Peak Flow --      Pain Score 06/23/23 1023 3     Pain Loc --      Pain Education --      Exclude from Growth Chart --    No data found.  Updated Vital Signs BP 116/75 (BP  Location: Left Arm)   Pulse 90   Temp 98.2 F (36.8 C) (Oral)   Resp 16   Wt 117 lb 8.1 oz (53.3 kg)   LMP  (LMP Unknown)   SpO2 97%   BMI 19.55 kg/m   Visual Acuity Right Eye Distance:   Left Eye Distance:   Bilateral Distance:    Right Eye Near:   Left Eye Near:    Bilateral Near:     Physical Exam Vitals reviewed.  Constitutional:      Appearance: Normal appearance.  HENT:     Head: Normocephalic and atraumatic.  Cardiovascular:     Rate and Rhythm: Normal rate and regular rhythm.  Pulmonary:     Effort: Pulmonary effort is normal.     Breath sounds: Normal breath sounds.  Skin:    General: Skin is warm and dry.     Capillary Refill: Capillary refill takes less than 2 seconds.  Neurological:     General: No focal deficit present.     Mental Status: She is alert.    UC Treatments / Results  Labs (all labs ordered are listed, but only abnormal results are displayed) Labs Reviewed  POCT URINALYSIS DIP (MANUAL ENTRY) - Abnormal; Notable for the following components:      Result Value   Clarity, UA cloudy (*)    Spec Grav, UA >=1.030 (*)    Blood, UA trace-intact (*)    All other components within normal limits  POCT URINE PREGNANCY - Normal  CERVICOVAGINAL ANCILLARY ONLY    EKG   Radiology No results found.  Procedures Procedures (including critical care time)  Medications Ordered in UC Medications - No data to display  Initial Impression / Assessment and Plan / UC Course  I have reviewed the triage vital signs and the nursing notes.  Pertinent labs & imaging results that were available during my care of the patient were reviewed by me and considered in my medical decision making (see chart for details).    Exposure to chlamydia treatment with doxycycline  twice daily for 7 days.  Pain is cytologies swab to test for chlamydia, gonorrhea, trichomonas, bacterial vaginosis and candidiasis.  Advised that our office will update her with any additional  abnormal results of her she is to complete treatment for chlamydia and avoid any sexual contact for 7 days or tell her partner has been successfully treated to prevent spreading infection back to each other.  Patient verbalized understanding and  agreement with plan.  Urine hCG is negative.  UA unremarkable. Final Clinical Impressions(s) / UC Diagnoses   Final diagnoses:  Exposure to chlamydia     Discharge Instructions      Lab results take up to 2-3 days, however can result sooner.   Our office will only call if results are abnormal and require treatment. If you see a result and have questions feel free to reach out via phone and call this office directly and request to speak with the nurse.  All results will be available to view on MyChart.    ED Prescriptions     Medication Sig Dispense Auth. Provider   doxycycline  (VIBRAMYCIN ) 100 MG capsule Take 1 capsule (100 mg total) by mouth 2 (two) times daily for 7 days. 14 capsule Buena Carmine, NP      PDMP not reviewed this encounter.   Buena Carmine, NP 06/24/23 [phone]

## 2023-06-23 NOTE — ED Triage Notes (Signed)
 Pt c/o vaginal discharge with an odor x 1 week.

## 2023-06-23 NOTE — Discharge Instructions (Signed)
 Lab results take up to 2-3 days, however can result sooner.   Our office will only call if results are abnormal and require treatment. If you see a result and have questions feel free to reach out via phone and call this office directly and request to speak with the nurse.  All results will be available to view on MyChart.

## 2023-06-26 LAB — CERVICOVAGINAL ANCILLARY ONLY
Bacterial Vaginitis (gardnerella): POSITIVE — AB
Candida Glabrata: NEGATIVE
Candida Vaginitis: NEGATIVE
Chlamydia: NEGATIVE
Comment: NEGATIVE
Comment: NEGATIVE
Comment: NEGATIVE
Comment: NEGATIVE
Comment: NEGATIVE
Comment: NORMAL
Neisseria Gonorrhea: NEGATIVE
Trichomonas: NEGATIVE

## 2023-06-27 ENCOUNTER — Telehealth: Payer: Self-pay | Admitting: Emergency Medicine

## 2023-06-27 ENCOUNTER — Ambulatory Visit (HOSPITAL_COMMUNITY): Payer: Self-pay

## 2023-06-27 MED ORDER — METRONIDAZOLE 500 MG PO TABS: 500.0000 mg | ORAL_TABLET | Freq: Two times a day (BID) | ORAL | 0 refills | Status: DC

## 2023-08-25 ENCOUNTER — Encounter: Payer: Self-pay | Admitting: Advanced Practice Midwife

## 2023-11-11 ENCOUNTER — Encounter: Payer: Self-pay | Admitting: Emergency Medicine

## 2023-11-11 ENCOUNTER — Ambulatory Visit
Admission: EM | Admit: 2023-11-11 | Discharge: 2023-11-11 | Disposition: A | Discharge status: Home / Self Care | Attending: Nurse Practitioner | Admitting: Nurse Practitioner

## 2023-11-11 DIAGNOSIS — N898 Other specified noninflammatory disorders of vagina: Secondary | ICD-10-CM | POA: Diagnosis present

## 2023-11-11 LAB — POCT URINE PREGNANCY: Preg Test, Ur: NEGATIVE

## 2023-11-11 MED ORDER — METRONIDAZOLE 0.75 % VA GEL
1.0000 | Freq: Every day | VAGINAL | 0 refills | Status: AC
Start: 2023-11-11 — End: 2023-11-16

## 2023-11-11 MED ORDER — FLUCONAZOLE 150 MG PO TABS: 150.0000 mg | ORAL_TABLET | ORAL | 0 refills | Status: AC

## 2023-11-11 NOTE — ED Triage Notes (Signed)
 Pt presents c/o vaginal odor x 7 days. Pt reports for the last week she has noticed a slight odor and yellowish discharge. Pt denies any additional sxs.

## 2023-11-11 NOTE — Discharge Instructions (Signed)
 You were seen today for vaginal discharge and odor which can occur when the normal balance of bacteria in the vagina changes. This can be influenced by various factors, including new or multiple sexual partners, unprotected sex, douching, smoking, certain antibiotics, or pregnancy. It's also possible to develop a vaginal infection even without being sexually active. Tests were performed today to check for bacteria, yeast, gonorrhea, chlamydia, and trichomonas. While results are pending, treatment has been initiated for the most common non-STD causes--bacterial vaginosis and yeast infection--based on your symptoms. It is important that you avoid any sexual activity until your test results have returned, your treatment is complete, and your symptoms have fully resolved. You will only be contacted if any of your test results are positive. You can also review your results through your MyChart account. During this time, avoid douching or using vaginal sprays or deodorants. Wear cotton or cotton-lined underwear to improve airflow and reduce moisture. Be sure to stay hydrated by drinking plenty of fluids. See your regular doctor or gynecologist if your symptoms do not start to improve with treatment. Go to the emergency room right away if you develop a fever, new or worsening pelvic pain, or if the vaginal discharge gets worse.

## 2023-11-11 NOTE — ED Provider Notes (Signed)
 [facility]    CSN: [id number] Arrival date & time: 11/11/23  0803      History   Chief Complaint No chief complaint on file.   HPI Victoria Estrada is a 23 y.o. female.   Discussed the use of AI scribe software for clinical note transcription with the patient, who gave verbal consent to proceed.   The patient presents with vaginal odor and discharge that has been present for the past week. She describes the odor as fishy in nature. She reports some associated itching but denies any pain, burning, or irritation with urination. Her last menstrual period was around the 4th of the month. She is not currently on birth control and is sexually active, having had two female partners in the past three months. She reports using condoms with one partner but not with the other.     Past Medical History:  Diagnosis Date   Migraine    Migraines     Patient Active Problem List   Diagnosis Date Noted   Post traumatic stress disorder 04/11/2023    History reviewed. No pertinent surgical history.  OB History   No obstetric history on file.      Home Medications    Prior to Admission medications   Medication Sig Start Date End Date Taking? Authorizing Provider  fluconazole (DIFLUCAN) 150 MG tablet Take 1 tablet (150 mg total) by mouth every 3 (three) days for 2 doses. 11/11/23 11/15/23 Yes Iola Lukes, FNP  metroNIDAZOLE  (METROGEL ) 0.75 % vaginal gel Place 1 Applicatorful vaginally at bedtime for 5 days. 11/11/23 11/16/23 Yes Iola Lukes, FNP  etonogestrel (NEXPLANON) 68 MG IMPL implant 1 each by Subdermal route once.    [provider]  hydrOXYzine (ATARAX) 25 MG tablet Take 25 mg by mouth 3 (three) times daily as needed.    [provider]  ibuprofen  (ADVIL ) 800 MG tablet Take 1 tablet (800 mg total) by mouth every 8 (eight) hours as needed (pain). 05/26/22   Vonna Sharlet POUR, MD  imiquimod  (ALDARA ) 5 % cream Apply topically 3 (three) times a week.  01/27/20   Nugent, Nicole E, NP  metFORMIN (GLUCOPHAGE) 500 MG tablet Take 500 mg by mouth. 05/16/23 11/12/23  [provider]  sertraline (ZOLOFT) 25 MG tablet Take 1 tablet by mouth daily. 08/17/23 08/16/24  [provider]  Vitamin D, Ergocalciferol, (DRISDOL) 1.25 MG (50000 UNIT) CAPS capsule Take 50,000 Units by mouth once a week. 05/17/23   [provider]    Family History Family History  Problem Relation Age of Onset   Cancer Maternal Grandmother    Diabetes Maternal Grandfather    Stroke Maternal Grandfather    Hypertension Father     Social History Social History   Tobacco Use   Smoking status: Never    Passive exposure: Never   Smokeless tobacco: Never  Vaping Use   Vaping status: Never Used  Substance Use Topics   Alcohol use: Yes    Comment: occasionally   Drug use: Not Currently    Types: Marijuana     Allergies   Patient has no known allergies.   Review of Systems Review of Systems  Constitutional:  Negative for fever.  Gastrointestinal:  Negative for nausea and vomiting.  Genitourinary:  Positive for vaginal discharge. Negative for dysuria and menstrual problem (LMP around 10/12/23).       Vaginal odor. No itching or irritation   Musculoskeletal:  Negative for back pain.  All other systems reviewed  and are negative.    Physical Exam Triage Vital Signs ED Triage Vitals  Encounter Vitals Group     BP 11/11/23 0823 127/87     Girls Systolic BP Percentile --      Girls Diastolic BP Percentile --      Boys Systolic BP Percentile --      Boys Diastolic BP Percentile --      Pulse Rate 11/11/23 0823 82     Resp 11/11/23 0823 16     Temp 11/11/23 0823 98.3 F (36.8 C)     Temp Source 11/11/23 0823 Oral     SpO2 11/11/23 0823 97 %     Weight 11/11/23 0822 117 lb 8.1 oz (53.3 kg)     Height --      Head Circumference --      Peak Flow --      Pain Score 11/11/23 0821 0     Pain Loc --      Pain Education --      Exclude from  Growth Chart --    No data found.  Updated Vital Signs BP 127/87 (BP Location: Left Arm)   Pulse 82   Temp 98.3 F (36.8 C) (Oral)   Resp 16   Wt 117 lb 8.1 oz (53.3 kg)   LMP 10/13/2023 (Approximate)   SpO2 97%   BMI 19.55 kg/m   Visual Acuity Right Eye Distance:   Left Eye Distance:   Bilateral Distance:    Right Eye Near:   Left Eye Near:    Bilateral Near:     Physical Exam Constitutional:      General: She is not in acute distress.    Appearance: Normal appearance. She is not ill-appearing, toxic-appearing or diaphoretic.  HENT:     Head: Normocephalic.     Nose: Nose normal.     Mouth/Throat:     Mouth: Mucous membranes are moist.  Eyes:     Conjunctiva/sclera: Conjunctivae normal.  Cardiovascular:     Rate and Rhythm: Normal rate.  Pulmonary:     Effort: Pulmonary effort is normal.  Abdominal:     Palpations: Abdomen is soft.  Genitourinary:    Comments: Deferred; patient performed self-swab for Aptima testing  Musculoskeletal:        General: Normal range of motion.     Cervical back: Normal range of motion and neck supple.  Skin:    General: Skin is warm and dry.  Neurological:     General: No focal deficit present.     Mental Status: She is alert and oriented to person, place, and time.  Psychiatric:        Mood and Affect: Mood normal.        Behavior: Behavior normal.      UC Treatments / Results  Labs (all labs ordered are listed, but only abnormal results are displayed) Labs Reviewed  POCT URINE PREGNANCY  CERVICOVAGINAL ANCILLARY ONLY    EKG   Radiology No results found.  Procedures Procedures (including critical care time)  Medications Ordered in UC Medications - No data to display  Initial Impression / Assessment and Plan / UC Course  I have reviewed the triage vital signs and the nursing notes.  Pertinent labs & imaging results that were available during my care of the patient were reviewed by me and considered in my  medical decision making (see chart for details).     Patient presents with vaginal discharge accompanied by odor. Vaginal  swabs were collected for gonorrhea, chlamydia, trichomonas, yeast, and bacterial vaginosis testing; results are pending. Empiric treatment initiated with metronidazole  (Flagyl ) and fluconazole (Diflucan). Further management will be guided by pending test results.  The patient was advised to maintain adequate hydration and to use barrier protection if sexually active to help reduce the risk of recurrent infection. She was informed that she will be contacted only if results are positive, though all results will be available for review through her MyChart account. She was instructed to follow up with her primary care provider or gynecologist if symptoms do not improve with treatment and to seek medical attention sooner if she develops fever, pelvic pain, or worsening discharge.  Today's evaluation has revealed no signs of a dangerous process. Discussed diagnosis with patient and/or guardian. Patient and/or guardian aware of their diagnosis, possible red flag symptoms to watch out for and need for close follow up. Patient and/or guardian understands verbal and written discharge instructions. Patient and/or guardian comfortable with plan and disposition.  Patient and/or guardian has a clear mental status at this time, good insight into illness (after discussion and teaching) and has clear judgment to make decisions regarding their care  Documentation was completed with the aid of voice recognition software. Transcription may contain typographical errors. Final Clinical Impressions(s) / UC Diagnoses   Final diagnoses:  Vaginal discharge     Discharge Instructions      You were seen today for vaginal discharge and odor which can occur when the normal balance of bacteria in the vagina changes. This can be influenced by various factors, including new or multiple sexual partners,  unprotected sex, douching, smoking, certain antibiotics, or pregnancy. It's also possible to develop a vaginal infection even without being sexually active. Tests were performed today to check for bacteria, yeast, gonorrhea, chlamydia, and trichomonas. While results are pending, treatment has been initiated for the most common non-STD causes--bacterial vaginosis and yeast infection--based on your symptoms. It is important that you avoid any sexual activity until your test results have returned, your treatment is complete, and your symptoms have fully resolved. You will only be contacted if any of your test results are positive. You can also review your results through your MyChart account. During this time, avoid douching or using vaginal sprays or deodorants. Wear cotton or cotton-lined underwear to improve airflow and reduce moisture. Be sure to stay hydrated by drinking plenty of fluids. See your regular doctor or gynecologist if your symptoms do not start to improve with treatment. Go to the emergency room right away if you develop a fever, new or worsening pelvic pain, or if the vaginal discharge gets worse.      ED Prescriptions     Medication Sig Dispense Auth. Provider   metroNIDAZOLE  (METROGEL ) 0.75 % vaginal gel Place 1 Applicatorful vaginally at bedtime for 5 days. 50 g Francenia Chimenti, FNP   fluconazole (DIFLUCAN) 150 MG tablet Take 1 tablet (150 mg total) by mouth every 3 (three) days for 2 doses. 2 tablet Iola Lukes, FNP      PDMP not reviewed this encounter.   Iola [place], [place] 11/11/23 [phone]

## 2023-11-13 ENCOUNTER — Ambulatory Visit (HOSPITAL_COMMUNITY): Payer: Self-pay

## 2023-11-13 LAB — CERVICOVAGINAL ANCILLARY ONLY
Bacterial Vaginitis (gardnerella): POSITIVE — AB
Candida Glabrata: NEGATIVE
Candida Vaginitis: NEGATIVE
Chlamydia: NEGATIVE
Comment: NEGATIVE
Comment: NEGATIVE
Comment: NEGATIVE
Comment: NEGATIVE
Comment: NEGATIVE
Comment: NORMAL
Neisseria Gonorrhea: NEGATIVE
Trichomonas: NEGATIVE

## 2023-11-13 MED ORDER — METRONIDAZOLE 500 MG PO TABS: 500.0000 mg | ORAL_TABLET | Freq: Two times a day (BID) | ORAL | 0 refills | Status: AC

## 2024-02-12 ENCOUNTER — Ambulatory Visit: Admission: EM | Admit: 2024-02-12 | Discharge: 2024-02-12 | Disposition: A | Discharge status: Home / Self Care

## 2024-02-12 DIAGNOSIS — N760 Acute vaginitis: Secondary | ICD-10-CM | POA: Insufficient documentation

## 2024-02-12 MED ORDER — METRONIDAZOLE 0.75 % VA GEL: 1.0000 | Freq: Every day | VAGINAL | 0 refills | Status: AC

## 2024-02-12 NOTE — ED Triage Notes (Addendum)
 Pt present with c/o vaginal odor  x 2 days. Pt states she does not eat well and recently had unprotected sex. Pt states she has an open partner. Pt also present with vaginal discharge x 1 day. Pt declined blood work.

## 2024-02-12 NOTE — ED Provider Notes (Signed)
 " [facility]    CSN: [id number] Arrival date & time: 02/12/24  1017      History   Chief Complaint Chief Complaint  Patient presents with   Vaginal Discharge    HPI KATRYNA TSCHIRHART is a 24 y.o. female.   Pt presents today due to 2 days worth of malodorous vaginal discharge and concern for STIs. Pt states that symptoms seem similar to previous episodes of BV.   The history is provided by the patient.  Vaginal Discharge   Past Medical History:  Diagnosis Date   Migraine    Migraines     Patient Active Problem List   Diagnosis Date Noted   Post traumatic stress disorder 04/11/2023    History reviewed. No pertinent surgical history.  OB History   No obstetric history on file.      Home Medications    Prior to Admission medications  Medication Sig Start Date End Date Taking? Authorizing Provider  ARIPiprazole (ABILIFY) 2 MG tablet Take 2 mg by mouth at bedtime. 01/31/24  Yes [provider]  hydrOXYzine (ATARAX) 25 MG tablet Take 25 mg by mouth 3 (three) times daily as needed.   Yes [provider]  ibuprofen  (ADVIL ) 800 MG tablet Take 1 tablet (800 mg total) by mouth every 8 (eight) hours as needed (pain). 05/26/22  Yes Vonna Sharlet POUR, MD  metroNIDAZOLE  (METROGEL ) 0.75 % vaginal gel Place 1 Applicatorful vaginally at bedtime for 7 days. 02/12/24 02/19/24 Yes Andra Corean BROCKS, PA-C    Family History Family History  Problem Relation Age of Onset   Cancer Maternal Grandmother    Diabetes Maternal Grandfather    Stroke Maternal Grandfather    Hypertension Father     Social History Social History[1]   Allergies   Patient has no known allergies.   Review of Systems Review of Systems  Genitourinary:  Positive for vaginal discharge.     Physical Exam Triage Vital Signs ED Triage Vitals  Encounter Vitals Group     BP 02/12/24 1045 127/84     Girls Systolic BP Percentile --      Girls Diastolic BP Percentile --       Boys Systolic BP Percentile --      Boys Diastolic BP Percentile --      Pulse Rate 02/12/24 1045 89     Resp 02/12/24 1045 18     Temp 02/12/24 1045 98.6 F (37 C)     Temp Source 02/12/24 1045 Oral     SpO2 02/12/24 1045 94 %     Weight --      Height --      Head Circumference --      Peak Flow --      Pain Score 02/12/24 1043 0     Pain Loc --      Pain Education --      Exclude from Growth Chart --    No data found.  Updated Vital Signs BP 127/84 (BP Location: Right Arm)   Pulse 89   Temp 98.6 F (37 C) (Oral)   Resp 18   LMP  (Within Months)   SpO2 94%   Visual Acuity Right Eye Distance:   Left Eye Distance:   Bilateral Distance:    Right Eye Near:   Left Eye Near:    Bilateral Near:     Physical Exam Vitals and nursing note reviewed.  Constitutional:      General: She is not in  acute distress.    Appearance: Normal appearance. She is not ill-appearing, toxic-appearing or diaphoretic.  Eyes:     General: No scleral icterus. Cardiovascular:     Rate and Rhythm: Normal rate and regular rhythm.     Heart sounds: Normal heart sounds.  Pulmonary:     Effort: Pulmonary effort is normal. No respiratory distress.     Breath sounds: Normal breath sounds. No wheezing or rhonchi.  Abdominal:     General: Abdomen is flat. Bowel sounds are normal.     Palpations: Abdomen is soft.     Tenderness: There is no abdominal tenderness. There is no right CVA tenderness or left CVA tenderness.  Skin:    General: Skin is warm.  Neurological:     Mental Status: She is alert and oriented to person, place, and time.  Psychiatric:        Mood and Affect: Mood normal.        Behavior: Behavior normal.      UC Treatments / Results  Labs (all labs ordered are listed, but only abnormal results are displayed) Labs Reviewed  CERVICOVAGINAL ANCILLARY ONLY    EKG   Radiology No results found.  Procedures Procedures (including critical care time)  Medications  Ordered in UC Medications - No data to display  Initial Impression / Assessment and Plan / UC Course  I have reviewed the triage vital signs and the nursing notes.  Pertinent labs & imaging results that were available during my care of the patient were reviewed by me and considered in my medical decision making (see chart for details).      Final Clinical Impressions(s) / UC Diagnoses   Final diagnoses:  Acute vaginitis   Discharge Instructions   None    ED Prescriptions     Medication Sig Dispense Auth. Provider   metroNIDAZOLE  (METROGEL ) 0.75 % vaginal gel Place 1 Applicatorful vaginally at bedtime for 7 days. 70 g Andra Corean BROCKS, PA-C      PDMP not reviewed this encounter.    [1]  Social History Tobacco Use   Smoking status: Never    Passive exposure: Never   Smokeless tobacco: Never  Vaping Use   Vaping status: Never Used  Substance Use Topics   Alcohol use: Yes    Comment: occasionally   Drug use: Not Currently    Types: Marijuana     Andra Corean BROCKS, PA-C 02/12/24 1112  "

## 2024-02-13 ENCOUNTER — Ambulatory Visit (HOSPITAL_COMMUNITY): Payer: Self-pay

## 2024-02-13 LAB — CERVICOVAGINAL ANCILLARY ONLY
Bacterial Vaginitis (gardnerella): POSITIVE — AB
Candida Glabrata: NEGATIVE
Candida Vaginitis: NEGATIVE
Chlamydia: NEGATIVE
Comment: NEGATIVE
Comment: NEGATIVE
Comment: NEGATIVE
Comment: NEGATIVE
Comment: NEGATIVE
Comment: NORMAL
Neisseria Gonorrhea: NEGATIVE
Trichomonas: NEGATIVE
